# Patient Record
Sex: Female | Born: 1950 | Race: White | Hispanic: No | Marital: Married | Smoking: Former smoker
Health system: Southern US, Community
[De-identification: ages and names within clinical notes are randomized; demographics above are authoritative.]

## PROBLEM LIST (undated history)

## (undated) DIAGNOSIS — K219 Gastro-esophageal reflux disease without esophagitis: Secondary | ICD-10-CM

## (undated) DIAGNOSIS — I10 Essential (primary) hypertension: Secondary | ICD-10-CM

## (undated) HISTORY — PX: CORONARY ANGIOPLASTY WITH STENT PLACEMENT: SHX49

## (undated) HISTORY — PX: ABDOMINAL HYSTERECTOMY: SHX81

---

## 2004-07-03 ENCOUNTER — Ambulatory Visit: Payer: Self-pay | Admitting: Obstetrics and Gynecology

## 2004-08-04 ENCOUNTER — Other Ambulatory Visit: Payer: Self-pay

## 2004-08-27 ENCOUNTER — Ambulatory Visit: Payer: Self-pay | Admitting: Obstetrics and Gynecology

## 2007-02-20 IMAGING — US US PELV - US TRANSVAGINAL
1 series · 17 of 17 positions shown · non-contrast
Comparison: none

REASON FOR EXAM: RLQ mass
COMMENTS:

PROCEDURE:     US  - US PELVIS MASS EXAM  - [DATE] [DATE] [DATE]  [DATE]
RESULT:        Real-time imaging was obtained.  The uterus is not identified
compatible with prior hysterectomy.  The ovaries are not visualized.  No
definitive adnexal masses and no fluid is noted in the pelvis.

[Series 1: us pelv - us transvaginal · 17 of 17 slices shown]
[im 1/17]
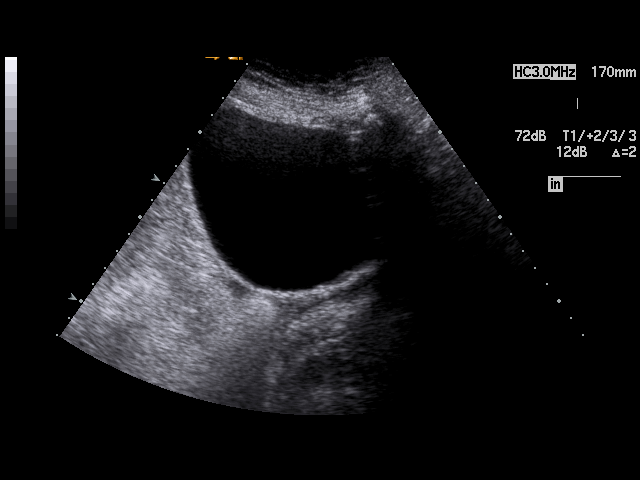
[im 2/17]
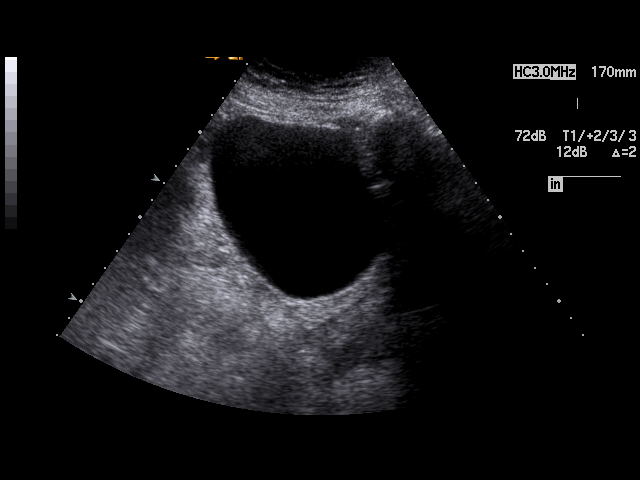
[im 3/17]
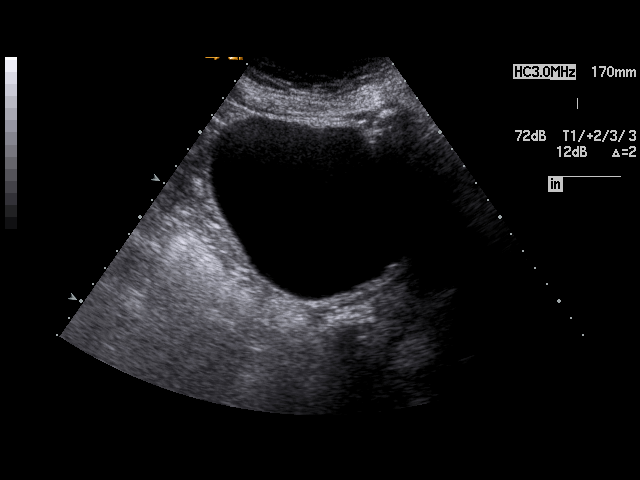
[im 4/17]
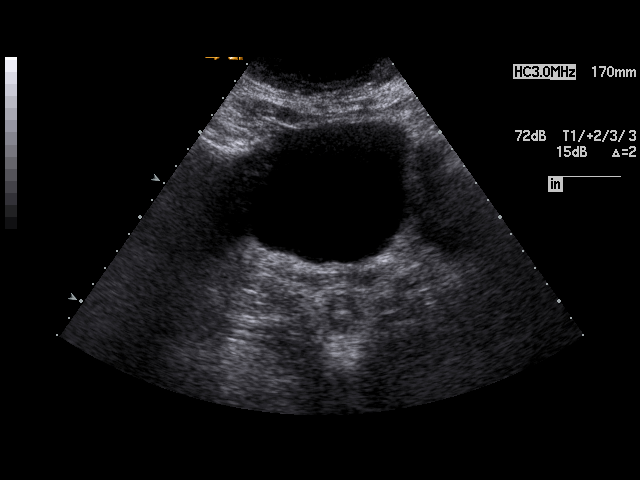
[im 5/17]
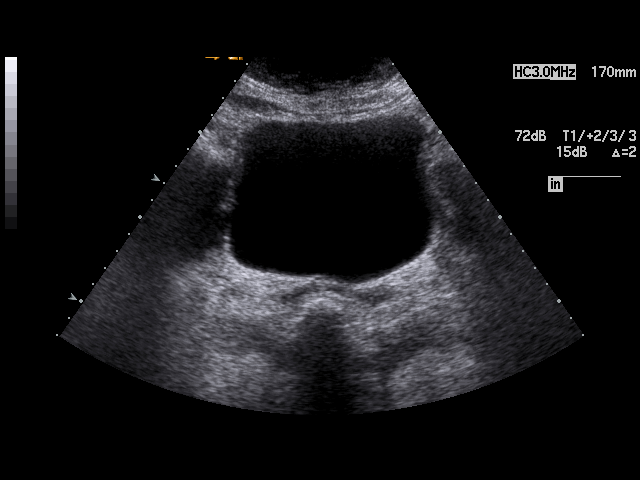
[im 6/17]
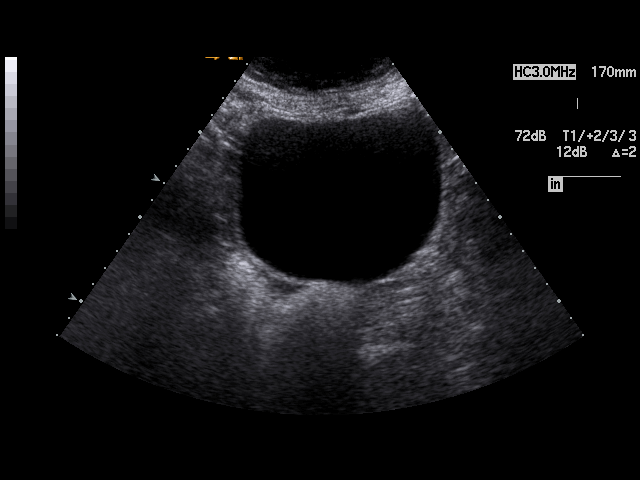
[im 7/17]
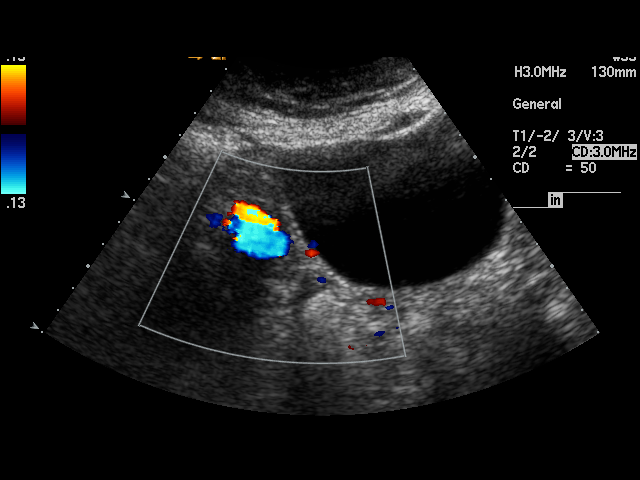
[im 8/17]
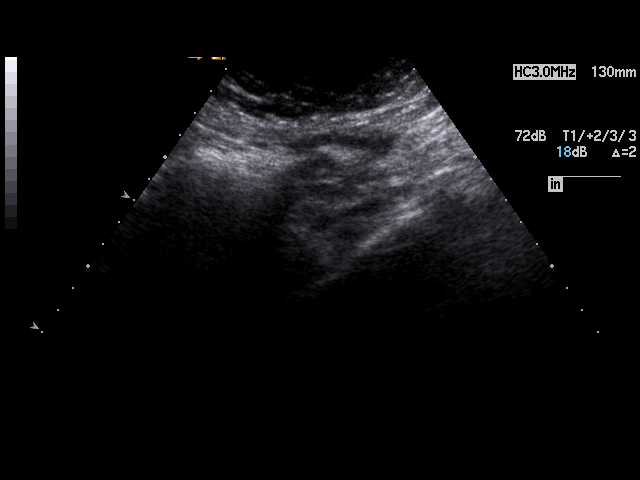
[im 9/17]
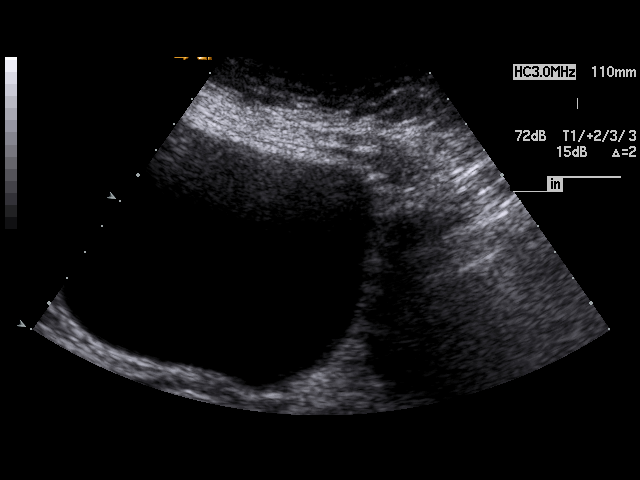
[im 10/17]
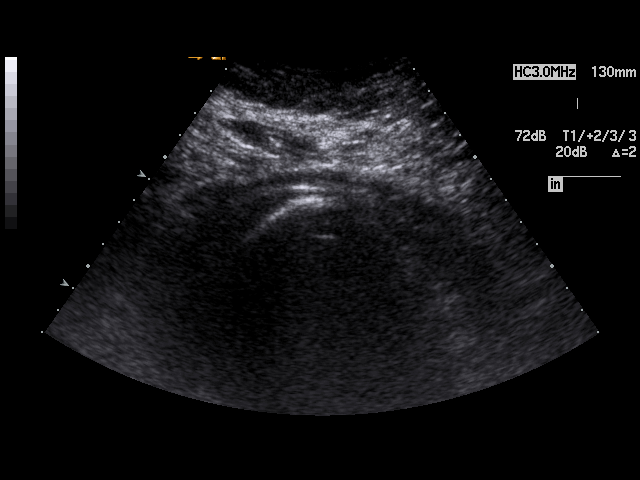
[im 11/17]
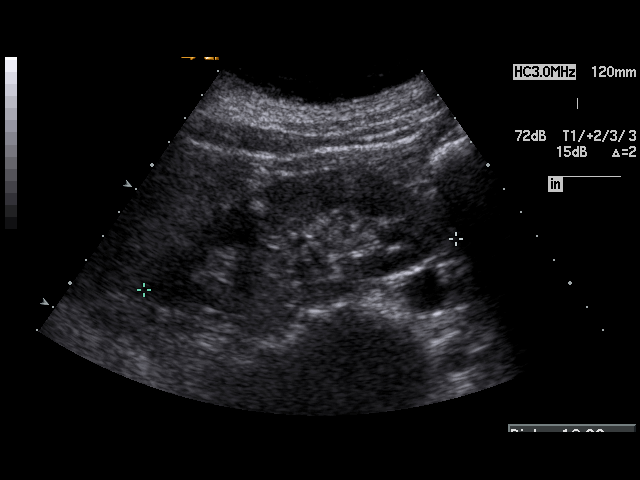
[im 12/17]
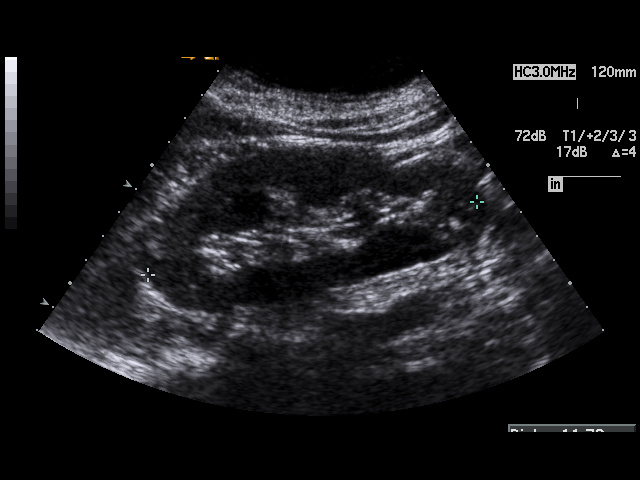
[im 13/17]
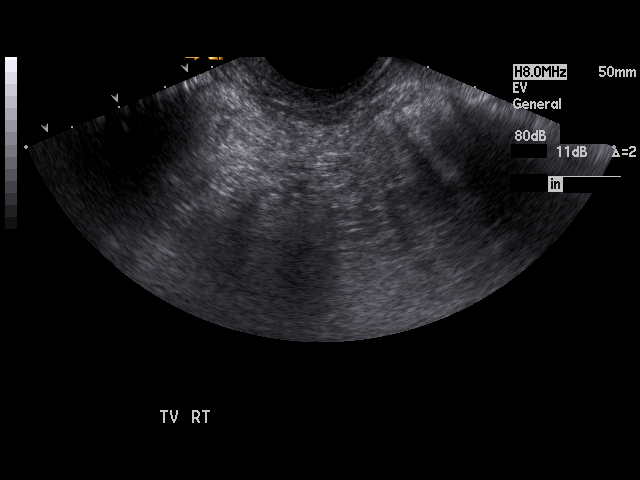
[im 14/17]
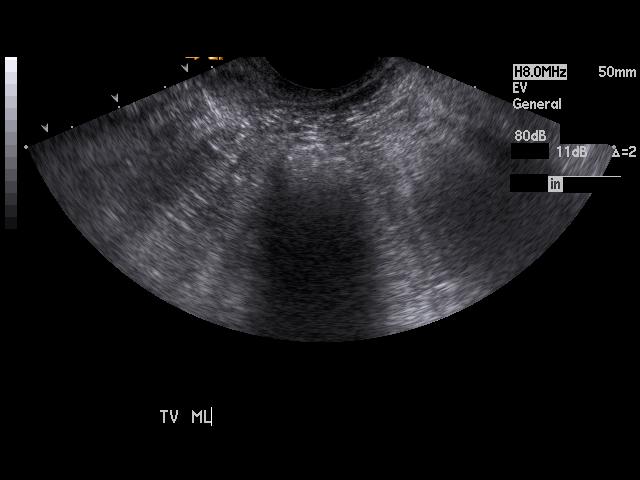
[im 15/17]
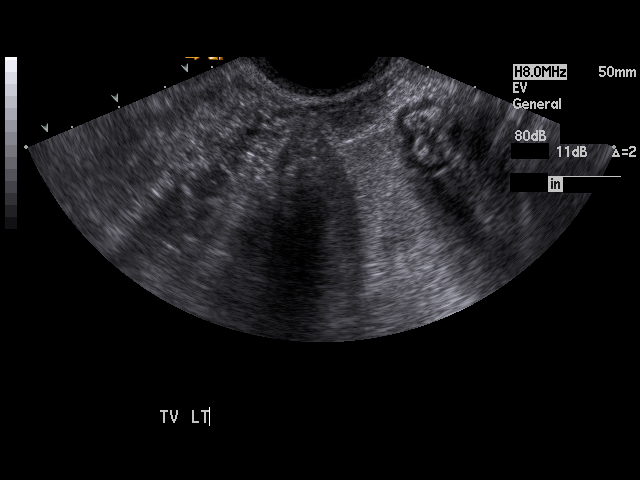
[im 16/17]
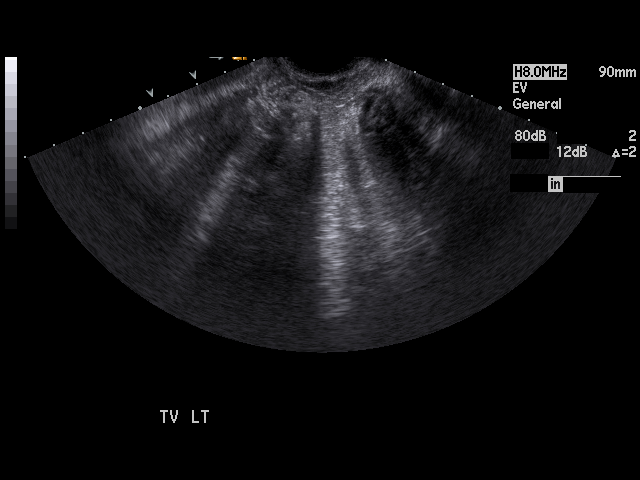
[im 17/17]
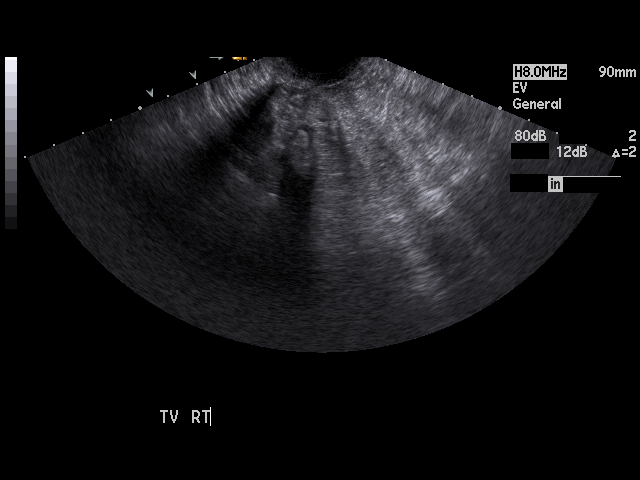

[17 of 17 positions shown; findings below may reference images not displayed]

IMPRESSION: No significant abnormalities noted on this post
hysterectomy patient.

## 2007-03-24 IMAGING — CR DG CHEST 2V
1 series · 2 of 2 positions shown · non-contrast
Comparison: none

REASON FOR EXAM: htn
COMMENTS:

PROCEDURE:     DXR - DXR CHEST PA (OR AP) AND LATERAL  - August 04, 2004  [DATE]
RESULT:        PA and lateral view reveals the heart to be normal in size.
The lung fields appear clear.  No effusions are noted.  Vascularity is
within normal limits.

[Series 208: postero_anterior · 0.22mm/px · 2 of 2 slices shown]
[im 1/2]
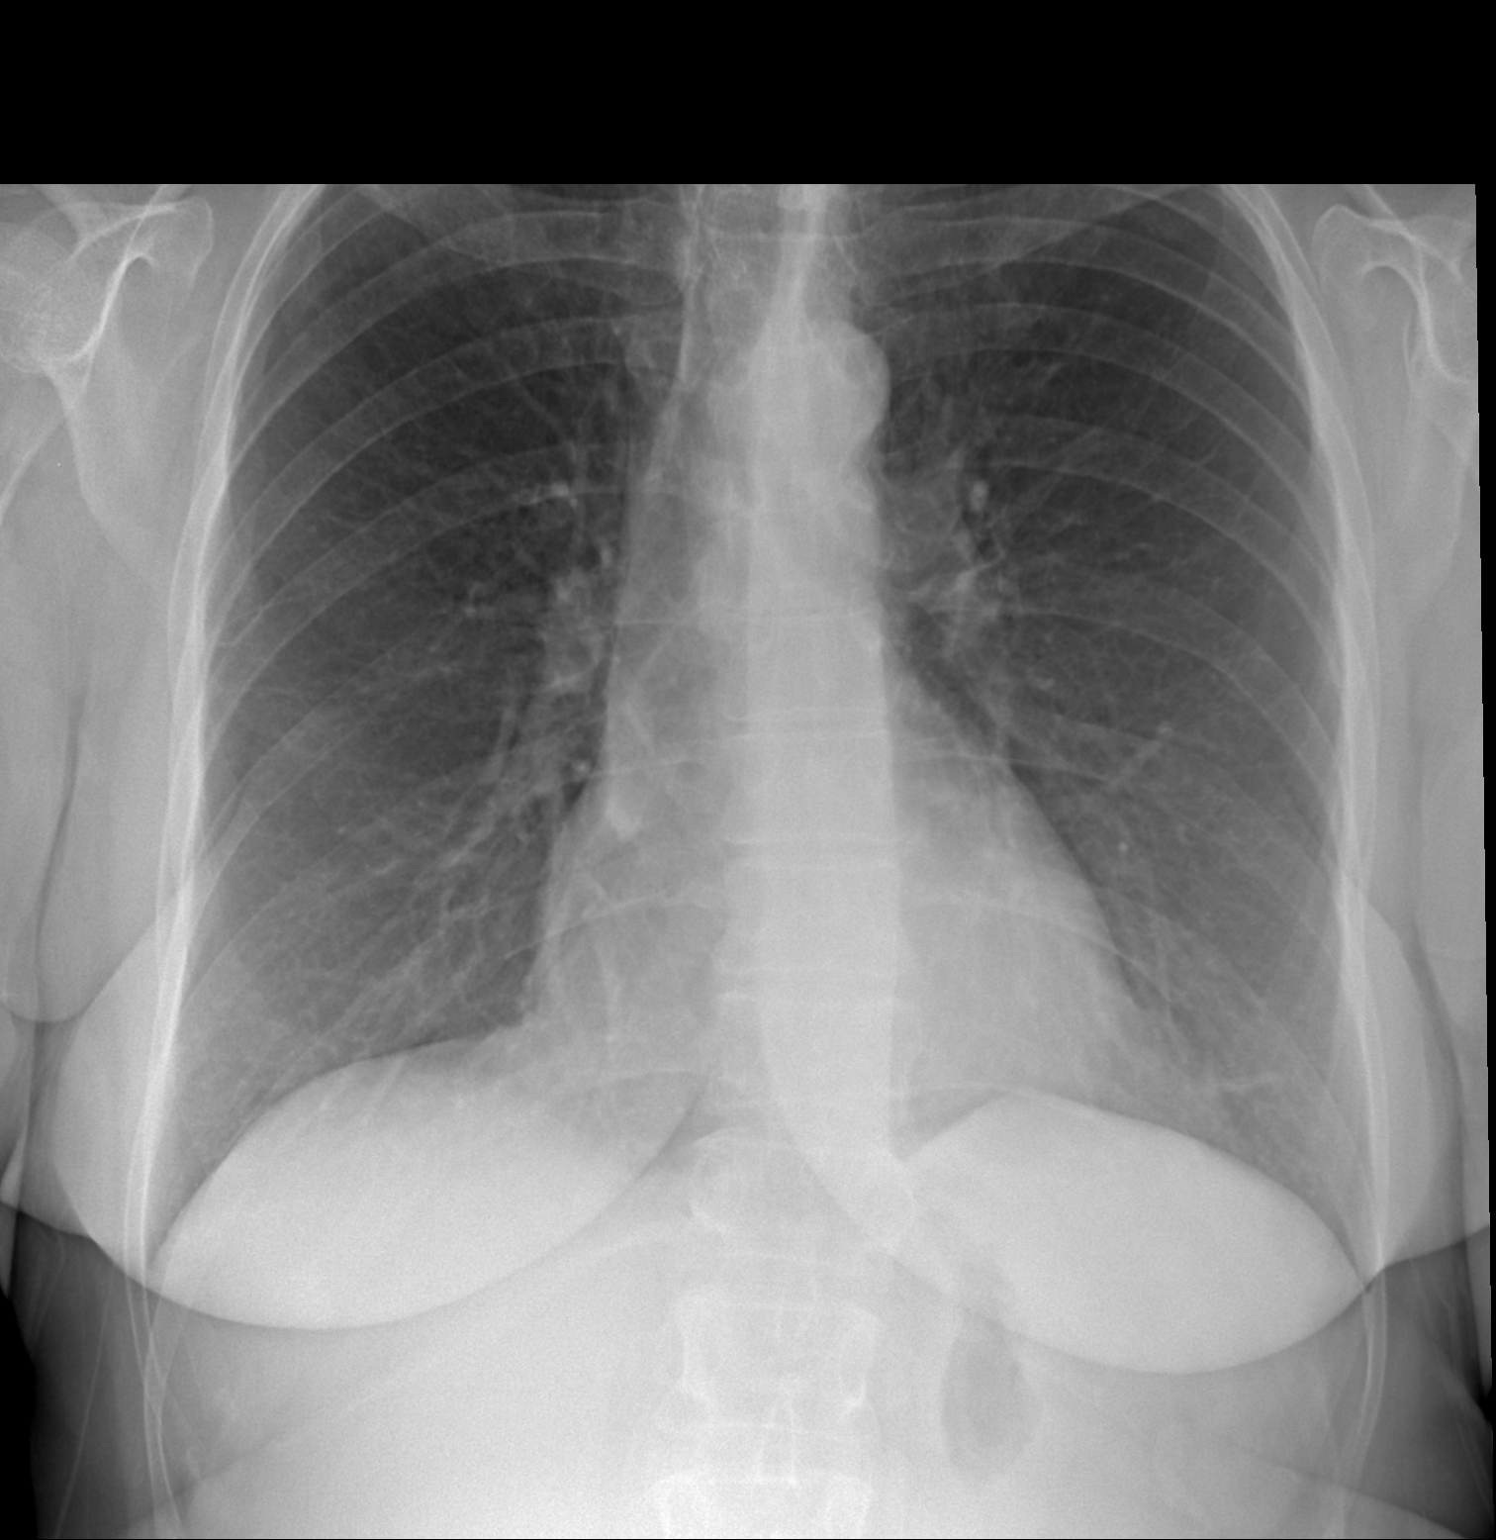
[im 2/2]
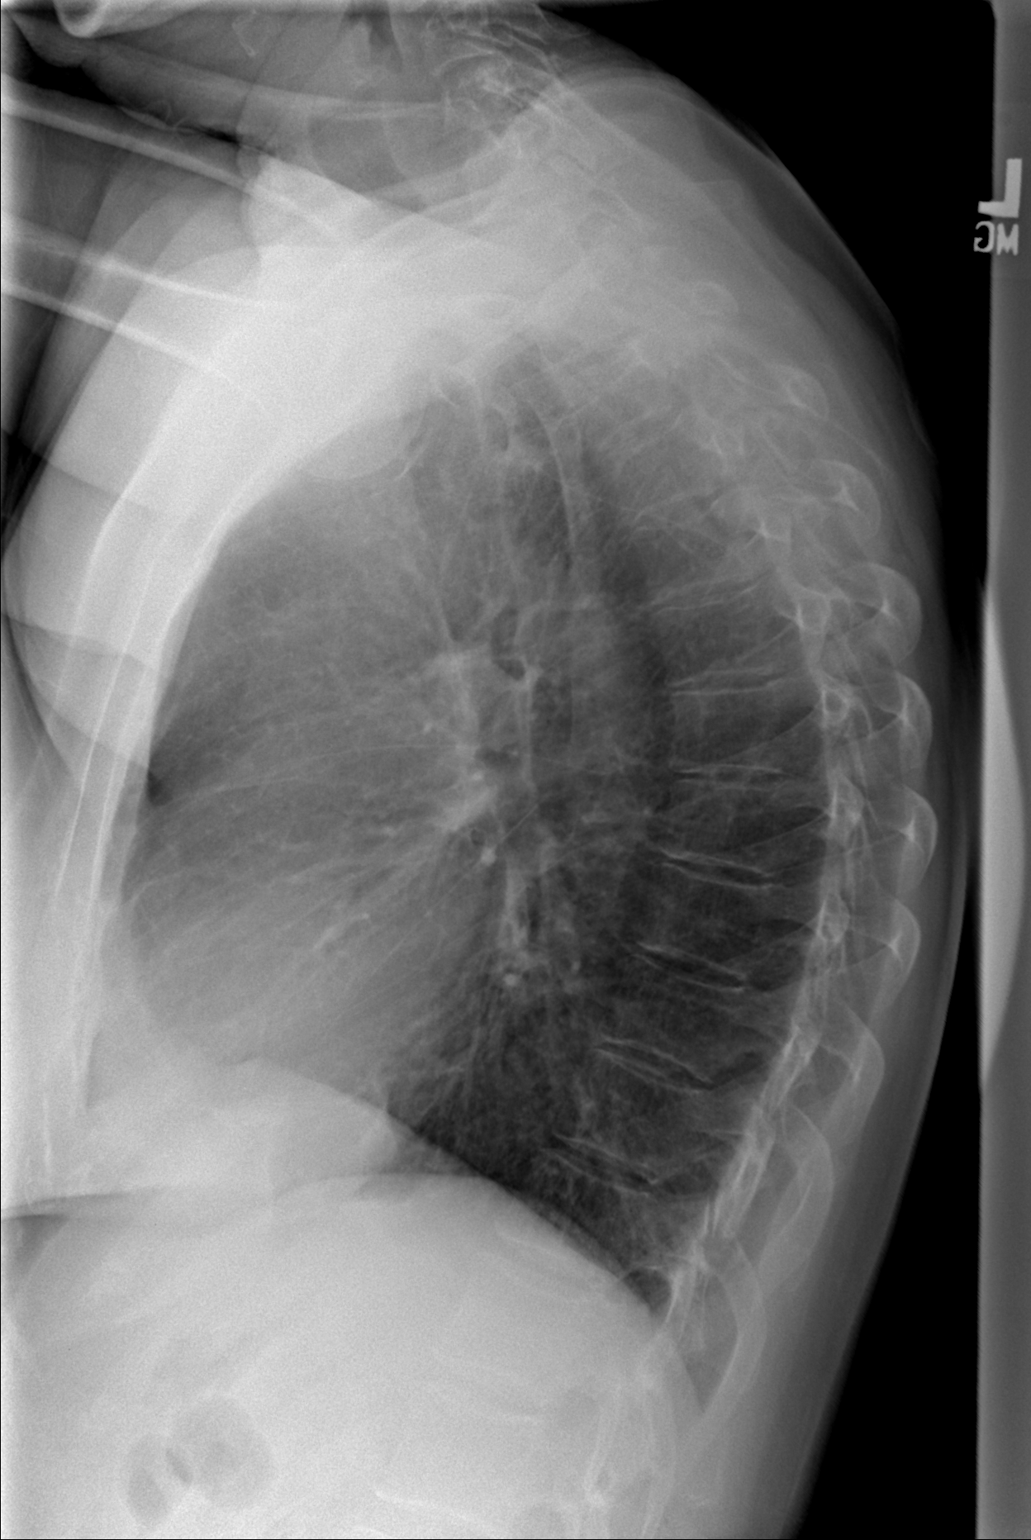

[2 of 2 positions shown; findings below may reference images not displayed]

IMPRESSION: The lung fields are clear.

## 2007-10-14 ENCOUNTER — Ambulatory Visit: Payer: Self-pay | Admitting: Internal Medicine

## 2007-11-20 ENCOUNTER — Ambulatory Visit: Payer: Self-pay | Admitting: Family Medicine

## 2008-07-11 ENCOUNTER — Ambulatory Visit: Payer: Self-pay | Admitting: Internal Medicine

## 2008-12-03 ENCOUNTER — Ambulatory Visit: Payer: Self-pay | Admitting: Internal Medicine

## 2013-08-25 ENCOUNTER — Ambulatory Visit: Payer: Self-pay | Admitting: Internal Medicine

## 2013-08-25 LAB — COMPREHENSIVE METABOLIC PANEL
ALBUMIN: 4.5 g/dL (ref 3.4–5.0)
ALK PHOS: 127 U/L — AB
AST: 22 U/L (ref 15–37)
Anion Gap: 9 (ref 7–16)
BUN: 20 mg/dL — ABNORMAL HIGH (ref 7–18)
Bilirubin,Total: 0.6 mg/dL (ref 0.2–1.0)
CHLORIDE: 102 mmol/L (ref 98–107)
CO2: 28 mmol/L (ref 21–32)
CREATININE: 0.76 mg/dL (ref 0.60–1.30)
Calcium, Total: 9.7 mg/dL (ref 8.5–10.1)
Glucose: 104 mg/dL — ABNORMAL HIGH (ref 65–99)
OSMOLALITY: 280 (ref 275–301)
Potassium: 4 mmol/L (ref 3.5–5.1)
SGPT (ALT): 31 U/L (ref 12–78)
SODIUM: 139 mmol/L (ref 136–145)
Total Protein: 8.9 g/dL — ABNORMAL HIGH (ref 6.4–8.2)

## 2013-08-25 LAB — CBC WITH DIFFERENTIAL/PLATELET
BASOS ABS: 0.1 10*3/uL (ref 0.0–0.1)
BASOS PCT: 0.9 %
Eosinophil #: 0.2 10*3/uL (ref 0.0–0.7)
Eosinophil %: 2.1 %
HCT: 43.3 % (ref 35.0–47.0)
HGB: 14.4 g/dL (ref 12.0–16.0)
Lymphocyte #: 1.7 10*3/uL (ref 1.0–3.6)
Lymphocyte %: 19.1 %
MCH: 29.4 pg (ref 26.0–34.0)
MCHC: 33.3 g/dL (ref 32.0–36.0)
MCV: 88 fL (ref 80–100)
MONO ABS: 0.4 x10 3/mm (ref 0.2–0.9)
Monocyte %: 4.4 %
Neutrophil #: 6.4 10*3/uL (ref 1.4–6.5)
Neutrophil %: 73.5 %
Platelet: 273 10*3/uL (ref 150–440)
RBC: 4.91 10*6/uL (ref 3.80–5.20)
RDW: 14.1 % (ref 11.5–14.5)
WBC: 8.7 10*3/uL (ref 3.6–11.0)

## 2013-08-25 LAB — URINALYSIS, COMPLETE
BILIRUBIN, UR: NEGATIVE
Blood: NEGATIVE
Glucose,UR: NEGATIVE mg/dL (ref 0–75)
Ketone: NEGATIVE
NITRITE: NEGATIVE
PROTEIN: NEGATIVE
Ph: 6 (ref 4.5–8.0)
RBC,UR: NONE SEEN /HPF (ref 0–5)
SPECIFIC GRAVITY: 1.02 (ref 1.003–1.030)

## 2013-08-25 LAB — SEDIMENTATION RATE: Erythrocyte Sed Rate: 6 mm/hr (ref 0–30)

## 2014-01-06 ENCOUNTER — Ambulatory Visit: Payer: Self-pay | Admitting: Physician Assistant

## 2014-04-05 ENCOUNTER — Ambulatory Visit: Payer: Self-pay | Admitting: Physician Assistant

## 2014-04-05 DIAGNOSIS — H6980 Other specified disorders of Eustachian tube, unspecified ear: Secondary | ICD-10-CM | POA: Diagnosis not present

## 2014-04-05 DIAGNOSIS — J329 Chronic sinusitis, unspecified: Secondary | ICD-10-CM | POA: Diagnosis not present

## 2014-04-05 DIAGNOSIS — I10 Essential (primary) hypertension: Secondary | ICD-10-CM | POA: Diagnosis not present

## 2014-04-12 DIAGNOSIS — E780 Pure hypercholesterolemia: Secondary | ICD-10-CM | POA: Diagnosis not present

## 2014-04-12 DIAGNOSIS — I209 Angina pectoris, unspecified: Secondary | ICD-10-CM | POA: Diagnosis not present

## 2014-05-17 DIAGNOSIS — M79672 Pain in left foot: Secondary | ICD-10-CM | POA: Diagnosis not present

## 2014-06-05 DIAGNOSIS — M25571 Pain in right ankle and joints of right foot: Secondary | ICD-10-CM | POA: Diagnosis not present

## 2014-06-10 DIAGNOSIS — Z79899 Other long term (current) drug therapy: Secondary | ICD-10-CM | POA: Diagnosis not present

## 2014-06-10 DIAGNOSIS — M25571 Pain in right ankle and joints of right foot: Secondary | ICD-10-CM | POA: Diagnosis not present

## 2014-06-10 DIAGNOSIS — I252 Old myocardial infarction: Secondary | ICD-10-CM | POA: Diagnosis not present

## 2014-06-10 DIAGNOSIS — K219 Gastro-esophageal reflux disease without esophagitis: Secondary | ICD-10-CM | POA: Diagnosis not present

## 2014-06-10 DIAGNOSIS — E785 Hyperlipidemia, unspecified: Secondary | ICD-10-CM | POA: Diagnosis not present

## 2014-06-10 DIAGNOSIS — T8484XA Pain due to internal orthopedic prosthetic devices, implants and grafts, initial encounter: Secondary | ICD-10-CM | POA: Diagnosis not present

## 2014-06-10 DIAGNOSIS — G8918 Other acute postprocedural pain: Secondary | ICD-10-CM | POA: Diagnosis not present

## 2014-06-10 DIAGNOSIS — Z01818 Encounter for other preprocedural examination: Secondary | ICD-10-CM | POA: Diagnosis not present

## 2014-06-10 DIAGNOSIS — M1990 Unspecified osteoarthritis, unspecified site: Secondary | ICD-10-CM | POA: Diagnosis not present

## 2014-06-10 DIAGNOSIS — I10 Essential (primary) hypertension: Secondary | ICD-10-CM | POA: Diagnosis not present

## 2014-06-10 DIAGNOSIS — Z955 Presence of coronary angioplasty implant and graft: Secondary | ICD-10-CM | POA: Diagnosis not present

## 2014-06-10 DIAGNOSIS — F40240 Claustrophobia: Secondary | ICD-10-CM | POA: Diagnosis not present

## 2014-06-13 DIAGNOSIS — G8918 Other acute postprocedural pain: Secondary | ICD-10-CM | POA: Diagnosis not present

## 2014-06-13 DIAGNOSIS — F40240 Claustrophobia: Secondary | ICD-10-CM | POA: Diagnosis not present

## 2014-06-13 DIAGNOSIS — M25571 Pain in right ankle and joints of right foot: Secondary | ICD-10-CM | POA: Diagnosis not present

## 2014-06-13 DIAGNOSIS — T8484XA Pain due to internal orthopedic prosthetic devices, implants and grafts, initial encounter: Secondary | ICD-10-CM | POA: Diagnosis not present

## 2014-06-13 DIAGNOSIS — I252 Old myocardial infarction: Secondary | ICD-10-CM | POA: Diagnosis not present

## 2014-06-13 DIAGNOSIS — I10 Essential (primary) hypertension: Secondary | ICD-10-CM | POA: Diagnosis not present

## 2014-08-22 DIAGNOSIS — E780 Pure hypercholesterolemia: Secondary | ICD-10-CM | POA: Diagnosis not present

## 2014-11-22 DIAGNOSIS — E780 Pure hypercholesterolemia: Secondary | ICD-10-CM | POA: Diagnosis not present

## 2015-01-01 DIAGNOSIS — I159 Secondary hypertension, unspecified: Secondary | ICD-10-CM | POA: Diagnosis not present

## 2015-01-01 DIAGNOSIS — I209 Angina pectoris, unspecified: Secondary | ICD-10-CM | POA: Diagnosis not present

## 2015-01-01 DIAGNOSIS — E7800 Pure hypercholesterolemia, unspecified: Secondary | ICD-10-CM | POA: Diagnosis not present

## 2015-11-28 ENCOUNTER — Ambulatory Visit
Admission: EM | Admit: 2015-11-28 | Discharge: 2015-11-28 | Disposition: A | Discharge status: Home / Self Care | Payer: Medicare Other | Attending: Family Medicine | Admitting: Family Medicine

## 2015-11-28 DIAGNOSIS — J0100 Acute maxillary sinusitis, unspecified: Secondary | ICD-10-CM | POA: Diagnosis not present

## 2015-11-28 MED ORDER — AMOXICILLIN-POT CLAVULANATE 875-125 MG PO TABS: 1.0000 | ORAL_TABLET | Freq: Two times a day (BID) | ORAL | 0 refills | Status: DC

## 2015-11-28 NOTE — ED Triage Notes (Signed)
Patient complains of sinus pain and pressure that started over 1 week ago. Patient states that she has been noticing tops of her gums are also sore.

## 2015-11-28 NOTE — ED Provider Notes (Signed)
[facility]    CSN: [id number] Arrival date & time: 11/28/15  [id number]  First Provider Contact:  None       History   Chief Complaint Chief Complaint  Patient presents with  . Sinus Problem    HPI Connie Blackwell is a 65 y.o. female.   The history is provided by the patient.  URI  Presenting symptoms: congestion, facial pain and fever   Severity:  Moderate Onset quality:  Sudden Duration:  1 week Timing:  Constant Progression:  Worsening Chronicity:  New Relieved by:  Nothing Ineffective treatments:  OTC medications Associated symptoms: sinus pain   Risk factors: sick contacts   Risk factors: not elderly, no chronic cardiac disease, no chronic kidney disease, no chronic respiratory disease, no diabetes mellitus, no immunosuppression, no recent illness and no recent travel     History reviewed. No pertinent past medical history.  There are no active problems to display for this patient.   Past Surgical History:  Procedure Laterality Date  . CORONARY ANGIOPLASTY WITH STENT PLACEMENT      OB History    No data available       Home Medications    Prior to Admission medications   Medication Sig Start Date End Date Taking? Authorizing Provider  atenolol (TENORMIN) 25 MG tablet Take 25 mg by mouth daily.   Yes Historical Provider, MD  famotidine (PEPCID) 20 MG tablet Take 20 mg by mouth 2 (two) times daily.   Yes Historical Provider, MD  amoxicillin-clavulanate (AUGMENTIN) 875-125 MG tablet Take 1 tablet by mouth 2 (two) times daily. 11/28/15   Payton Mccallumrlando Helena Sardo, MD    Family History History reviewed. No pertinent family history.  Social History Social History  Substance Use Topics  . Smoking status: Former Games developermoker  . Smokeless tobacco: Never Used  . Alcohol use No     Allergies   Sulfa antibiotics   Review of Systems Review of Systems  Constitutional: Positive for fever.  HENT: Positive for congestion.      Physical Exam Triage  Vital Signs ED Triage Vitals  Enc Vitals Group     BP 11/28/15 1037 (!) 208/70     Pulse Rate 11/28/15 1037 (!) 57     Resp 11/28/15 1037 16     Temp 11/28/15 1037 97.2 F (36.2 C)     Temp Source 11/28/15 1037 Tympanic     SpO2 11/28/15 1037 99 %     Weight 11/28/15 1034 162 lb (73.5 kg)     Height 11/28/15 1034 5\' 7"  (1.702 m)     Head Circumference --      Peak Flow --      Pain Score 11/28/15 1036 7     Pain Loc --      Pain Edu? --      Excl. in GC? --    No data found.   Updated Vital Signs BP (!) 142/82 (BP Location: Left Arm)   Pulse (!) 57   Temp 97.2 F (36.2 C) (Tympanic)   Resp 16   Ht 5\' 7"  (1.702 m)   Wt 162 lb (73.5 kg)   SpO2 99%   BMI 25.37 kg/m   Visual Acuity Right Eye Distance:   Left Eye Distance:   Bilateral Distance:    Right Eye Near:   Left Eye Near:    Bilateral Near:     Physical Exam  Constitutional: She appears well-developed and well-nourished. No distress.  HENT:  Head: Normocephalic  and atraumatic.  Right Ear: Tympanic membrane, external ear and ear canal normal.  Left Ear: Tympanic membrane, external ear and ear canal normal.  Nose: Mucosal edema and rhinorrhea present. No nose lacerations, sinus tenderness, nasal deformity, septal deviation or nasal septal hematoma. No epistaxis.  No foreign bodies. Right sinus exhibits maxillary sinus tenderness and frontal sinus tenderness. Left sinus exhibits maxillary sinus tenderness and frontal sinus tenderness.  Mouth/Throat: Uvula is midline, oropharynx is clear and moist and mucous membranes are normal. No oropharyngeal exudate.  Eyes: Conjunctivae and EOM are normal. Pupils are equal, round, and reactive to light. Right eye exhibits no discharge. Left eye exhibits no discharge. No scleral icterus.  Neck: Normal range of motion. Neck supple. No thyromegaly present.  Cardiovascular: Normal rate, regular rhythm and normal heart sounds.   Pulmonary/Chest: Effort normal and breath sounds  normal. No respiratory distress. She has no wheezes. She has no rales.  Lymphadenopathy:    She has no cervical adenopathy.  Skin: She is not diaphoretic.  Nursing note and vitals reviewed.    UC Treatments / Results  Labs (all labs ordered are listed, but only abnormal results are displayed) Labs Reviewed - No data to display  EKG  EKG Interpretation None       Radiology No results found.  Procedures Procedures (including critical care time)  Medications Ordered in UC Medications - No data to display   Initial Impression / Assessment and Plan / UC Course  I have reviewed the triage vital signs and the nursing notes.  Pertinent labs & imaging results that were available during my care of the patient were reviewed by me and considered in my medical decision making (see chart for details).  Clinical Course      Final Clinical Impressions(s) / UC Diagnoses   Final diagnoses:  Acute maxillary sinusitis, recurrence not specified    New Prescriptions Discharge Medication List as of 11/28/2015 12:03 PM    START taking these medications   Details  amoxicillin-clavulanate (AUGMENTIN) 875-125 MG tablet Take 1 tablet by mouth 2 (two) times daily., Starting Fri 11/28/2015, Normal       1. diagnosis reviewed with patient 2. rx as per orders above; reviewed possible side effects, interactions, risks and benefits  3. Recommend supportive treatment with otc analgesics 4. Follow-up prn if symptoms worsen or don't improve   Payton Mccallum, MD 11/28/15 1447

## 2016-01-16 DIAGNOSIS — I10 Essential (primary) hypertension: Secondary | ICD-10-CM | POA: Diagnosis not present

## 2016-01-16 DIAGNOSIS — E7800 Pure hypercholesterolemia, unspecified: Secondary | ICD-10-CM | POA: Diagnosis not present

## 2016-01-16 DIAGNOSIS — R0789 Other chest pain: Secondary | ICD-10-CM | POA: Diagnosis not present

## 2016-01-28 DIAGNOSIS — I10 Essential (primary) hypertension: Secondary | ICD-10-CM | POA: Diagnosis not present

## 2016-02-20 DIAGNOSIS — I10 Essential (primary) hypertension: Secondary | ICD-10-CM | POA: Diagnosis not present

## 2016-02-20 DIAGNOSIS — E7800 Pure hypercholesterolemia, unspecified: Secondary | ICD-10-CM | POA: Diagnosis not present

## 2016-04-11 ENCOUNTER — Ambulatory Visit
Admission: EM | Admit: 2016-04-11 | Discharge: 2016-04-11 | Disposition: A | Discharge status: Home / Self Care | Payer: Medicare Other | Attending: Emergency Medicine | Admitting: Emergency Medicine

## 2016-04-11 ENCOUNTER — Encounter: Payer: Self-pay | Admitting: Emergency Medicine

## 2016-04-11 DIAGNOSIS — K0510 Chronic gingivitis, plaque induced: Secondary | ICD-10-CM

## 2016-04-11 HISTORY — DX: Essential (primary) hypertension: I10

## 2016-04-11 HISTORY — DX: Gastro-esophageal reflux disease without esophagitis: K21.9

## 2016-04-11 MED ORDER — AMOXICILLIN-POT CLAVULANATE 875-125 MG PO TABS
1.0000 | ORAL_TABLET | Freq: Two times a day (BID) | ORAL | 0 refills | Status: DC
Start: 2016-04-11 — End: 2017-04-05

## 2016-04-11 NOTE — ED Provider Notes (Signed)
CSN: [id number]     Arrival date & time 04/11/16  1120 History   First MD Initiated Contact with Patient 04/11/16 1344     Chief Complaint  Patient presents with  . Headache  . Cough  . Generalized Body Aches   (Consider location/radiation/quality/duration/timing/severity/associated sxs/prior Treatment) HPI  This a 66 year old female who presents with cough take tooth pain and vomiting for the last 2 days. She states she has not been vomiting today. Has had pain and jaw pain which she is wondering if may have caused her symptoms that she is presenting with. Unable to afford seeing a dentist and has been on the waiting list at the health department for a year. He tells me today that she waits until her teeth loosen and she will then pull them herself. Her remaining teeth are in very poor condition as are her gumss. She has been in bed for 2 days the nausea vomiting and fatigue but has since recovered from those complaints      Past Medical History:  Diagnosis Date  . GERD (gastroesophageal reflux disease)   . Hypertension    Past Surgical History:  Procedure Laterality Date  . ABDOMINAL HYSTERECTOMY    . CORONARY ANGIOPLASTY WITH STENT PLACEMENT     History reviewed. No pertinent family history. Social History  Substance Use Topics  . Smoking status: Former Games developermoker  . Smokeless tobacco: Never Used  . Alcohol use No   OB History    No data available     Review of Systems  Constitutional: Positive for activity change, fatigue and fever. Negative for chills.  HENT: Positive for dental problem.   Respiratory: Negative for cough, shortness of breath, wheezing and stridor.   All other systems reviewed and are negative.   Allergies  Sulfa antibiotics  Home Medications   Prior to Admission medications   Medication Sig Start Date End Date Taking? Authorizing Provider  amoxicillin-clavulanate (AUGMENTIN) 875-125 MG tablet Take 1 tablet by mouth every 12 (twelve) hours. 04/11/16    Lutricia FeilWilliam P Roemer, PA-C  atenolol (TENORMIN) 25 MG tablet Take 25 mg by mouth daily.    Historical Provider, MD  famotidine (PEPCID) 20 MG tablet Take 20 mg by mouth 2 (two) times daily.    Historical Provider, MD   Meds Ordered and Administered this Visit  Medications - No data to display  BP 134/64 (BP Location: Left Arm)   Pulse 64   Temp 98.9 F (37.2 C) (Oral)   Resp 16   Ht 5\' 7"  (1.702 m)   Wt 162 lb (73.5 kg)   SpO2 98%   BMI 25.37 kg/m  No data found.   Physical Exam  Constitutional: She is oriented to person, place, and time. She appears well-developed and well-nourished. No distress.  HENT:  Head: Normocephalic and atraumatic.  Right Ear: External ear normal.  Left Ear: External ear normal.  Mouth/Throat: Oropharynx is clear and moist.  Examination of the teeth and gums shows them to be in very poor repair with sparse amount of teeth remaining. Gums receede from the tooth itself exposing the pulp. numerous caries present. Gums are very erythematous and have an orange to purple hue to them. He has several nodes in anterior cervical chain and submandibular.  Eyes: EOM are normal. Pupils are equal, round, and reactive to light. Right eye exhibits no discharge. Left eye exhibits no discharge.  Neck: Normal range of motion. Neck supple.  Pulmonary/Chest: Effort normal and breath sounds normal. She has  no wheezes. She has no rales.  Musculoskeletal: Normal range of motion.  Lymphadenopathy:    She has cervical adenopathy.  Neurological: She is alert and oriented to person, place, and time.  Skin: Skin is warm and dry. She is not diaphoretic.  Psychiatric: She has a normal mood and affect. Her behavior is normal. Judgment and thought content normal.  Nursing note and vitals reviewed.   Urgent Care Course     Procedures (including critical care time)  Labs Review Labs Reviewed - No data to display  Imaging Review No results found.   Visual Acuity Review  Right  Eye Distance:   Left Eye Distance:   Bilateral Distance:    Right Eye Near:   Left Eye Near:    Bilateral Near:         MDM   1. Gingivitis    Discharge Medication List as of 04/11/2016  2:01 PM    START taking these medications   Details  amoxicillin-clavulanate (AUGMENTIN) 875-125 MG tablet Take 1 tablet by mouth every 12 (twelve) hours., Starting Sun 04/11/2016, Normal      Discussion with the patient regarding the poor state of repair that her teeth are in. Been trying to have them fixed seems to be in a long list at the health department. I gave her couple of options including [facility] and also a dentist as except Medicare Medicaid. We will try to arrange an appointment with either place as soon as possible. Meantime I have given her information on gingivitis. Place her on a course of Augmentin due totenderness  and erythematous gums that she presents with.    Lutricia Feil, PA-C 04/11/16 1418

## 2016-04-11 NOTE — ED Triage Notes (Signed)
Patient c/o fever, cough HAs, and bodyaches for the past 2 days.

## 2016-04-28 DIAGNOSIS — I10 Essential (primary) hypertension: Secondary | ICD-10-CM | POA: Diagnosis not present

## 2016-05-12 DIAGNOSIS — I10 Essential (primary) hypertension: Secondary | ICD-10-CM | POA: Diagnosis not present

## 2016-05-24 ENCOUNTER — Encounter: Payer: Self-pay | Admitting: *Deleted

## 2016-05-24 ENCOUNTER — Ambulatory Visit
Admission: EM | Admit: 2016-05-24 | Discharge: 2016-05-24 | Disposition: A | Discharge status: Home / Self Care | Payer: Medicare Other | Attending: Family Medicine | Admitting: Family Medicine

## 2016-05-24 DIAGNOSIS — H6500 Acute serous otitis media, unspecified ear: Secondary | ICD-10-CM

## 2016-05-24 DIAGNOSIS — H6501 Acute serous otitis media, right ear: Secondary | ICD-10-CM | POA: Diagnosis not present

## 2016-05-24 MED ORDER — AMOXICILLIN 875 MG PO TABS: 875.0000 mg | ORAL_TABLET | Freq: Two times a day (BID) | ORAL | 0 refills | Status: DC

## 2016-05-24 NOTE — ED Provider Notes (Signed)
[facility]    CSN: [id number] Arrival date & time: 05/24/16  1157     History   Chief Complaint Chief Complaint  Patient presents with  . Otalgia  . Headache    HPI Connie Blackwell is a 66 y.o. female.   The history is provided by the patient.  Otalgia  Location:  Right Behind ear:  No abnormality Quality:  Aching and dull Severity:  Mild Onset quality:  Sudden Duration:  3 days Timing:  Constant Progression:  Worsening Chronicity:  New Context: not direct blow, not elevation change, not foreign body in ear, not loud noise, not recent URI and not water in ear   Relieved by:  None tried Ineffective treatments:  None tried Associated symptoms: congestion, headaches and rhinorrhea   Associated symptoms: no abdominal pain, no cough, no diarrhea, no ear discharge, no fever, no hearing loss, no neck pain, no rash, no sore throat, no tinnitus and no vomiting   Headache  Associated symptoms: congestion and ear pain   Associated symptoms: no abdominal pain, no cough, no diarrhea, no fever, no hearing loss, no neck pain, no sore throat and no vomiting     Past Medical History:  Diagnosis Date  . GERD (gastroesophageal reflux disease)   . Hypertension     There are no active problems to display for this patient.   Past Surgical History:  Procedure Laterality Date  . ABDOMINAL HYSTERECTOMY    . CORONARY ANGIOPLASTY WITH STENT PLACEMENT      OB History    No data available       Home Medications    Prior to Admission medications   Medication Sig Start Date End Date Taking? Authorizing Provider  aspirin 81 MG chewable tablet Chew 81 mg by mouth daily.   Yes Historical Provider, MD  atenolol (TENORMIN) 25 MG tablet Take 25 mg by mouth daily.   Yes Historical Provider, MD  famotidine (PEPCID) 20 MG tablet Take 20 mg by mouth 2 (two) times daily.   Yes Historical Provider, MD  hydrochlorothiazide (HYDRODIURIL) 25 MG tablet Take 25 mg by mouth daily.    Yes Historical Provider, MD  amoxicillin (AMOXIL) 875 MG tablet Take 1 tablet (875 mg total) by mouth 2 (two) times daily. 05/24/16   Payton Mccallumrlando Othman Masur, MD  amoxicillin-clavulanate (AUGMENTIN) 875-125 MG tablet Take 1 tablet by mouth every 12 (twelve) hours. 04/11/16   Lutricia FeilWilliam P Roemer, PA-C    Family History History reviewed. No pertinent family history.  Social History Social History  Substance Use Topics  . Smoking status: Former Games developermoker  . Smokeless tobacco: Never Used  . Alcohol use No     Allergies   Sulfa antibiotics   Review of Systems Review of Systems  Constitutional: Negative for fever.  HENT: Positive for congestion, ear pain and rhinorrhea. Negative for ear discharge, hearing loss, sore throat and tinnitus.   Respiratory: Negative for cough.   Gastrointestinal: Negative for abdominal pain, diarrhea and vomiting.  Musculoskeletal: Negative for neck pain.  Skin: Negative for rash.  Neurological: Positive for headaches.     Physical Exam Triage Vital Signs ED Triage Vitals  Enc Vitals Group     BP 05/24/16 1225 (!) 177/72     Pulse Rate 05/24/16 1225 (!) 51     Resp 05/24/16 1225 15     Temp 05/24/16 1225 98.1 F (36.7 C)     Temp Source 05/24/16 1225 Oral     SpO2 05/24/16 1225 99 %  Weight --      Height --      Head Circumference --      Peak Flow --      Pain Score 05/24/16 1228 9     Pain Loc --      Pain Edu? --      Excl. in GC? --    No data found.   Updated Vital Signs BP (!) 177/72 (BP Location: Right Arm)   Pulse (!) 51   Temp 98.1 F (36.7 C) (Oral)   Resp 15   SpO2 99%   Visual Acuity Right Eye Distance:   Left Eye Distance:   Bilateral Distance:    Right Eye Near:   Left Eye Near:    Bilateral Near:     Physical Exam  Constitutional: She appears well-developed and well-nourished. No distress.  HENT:  Head: Normocephalic and atraumatic.  Right Ear: External ear and ear canal normal. Tympanic membrane is injected and  bulging.  Left Ear: Tympanic membrane, external ear and ear canal normal.  Nose: Mucosal edema and rhinorrhea present. No nose lacerations, sinus tenderness, nasal deformity, septal deviation or nasal septal hematoma. No epistaxis.  No foreign bodies. Right sinus exhibits no maxillary sinus tenderness and no frontal sinus tenderness. Left sinus exhibits no maxillary sinus tenderness and no frontal sinus tenderness.  Mouth/Throat: Uvula is midline, oropharynx is clear and moist and mucous membranes are normal. No oropharyngeal exudate.  Eyes: Conjunctivae and EOM are normal. Pupils are equal, round, and reactive to light. Right eye exhibits no discharge. Left eye exhibits no discharge. No scleral icterus.  Neck: Normal range of motion. Neck supple. No thyromegaly present.  Pulmonary/Chest: Effort normal and breath sounds normal. No respiratory distress.  Lymphadenopathy:    She has no cervical adenopathy.  Skin: She is not diaphoretic.  Nursing note and vitals reviewed.    UC Treatments / Results  Labs (all labs ordered are listed, but only abnormal results are displayed) Labs Reviewed - No data to display  EKG  EKG Interpretation None       Radiology No results found.  Procedures Procedures (including critical care time)  Medications Ordered in UC Medications - No data to display   Initial Impression / Assessment and Plan / UC Course  I have reviewed the triage vital signs and the nursing notes.  Pertinent labs & imaging results that were available during my care of the patient were reviewed by me and considered in my medical decision making (see chart for details).      Final Clinical Impressions(s) / UC Diagnoses   Final diagnoses:  Acute serous otitis media, recurrence not specified, unspecified laterality    New Prescriptions Discharge Medication List as of 05/24/2016  1:31 PM    START taking these medications   Details  amoxicillin (AMOXIL) 875 MG tablet  Take 1 tablet (875 mg total) by mouth 2 (two) times daily., Starting Mon 05/24/2016, Normal       1. diagnosis reviewed with patient 2. rx as per orders above; reviewed possible side effects, interactions, risks and benefits  3. Recommend supportive treatment with otc analgesics prn  4. Follow-up prn if symptoms worsen or don't improve   Payton Mccallum, MD 05/24/16 1338

## 2016-05-24 NOTE — ED Triage Notes (Signed)
Patient started having right ear pain 3 days ago. No previous history of ear pain.

## 2016-06-09 DIAGNOSIS — Z882 Allergy status to sulfonamides status: Secondary | ICD-10-CM | POA: Diagnosis not present

## 2016-06-09 DIAGNOSIS — I2510 Atherosclerotic heart disease of native coronary artery without angina pectoris: Secondary | ICD-10-CM | POA: Diagnosis not present

## 2016-06-09 DIAGNOSIS — E7800 Pure hypercholesterolemia, unspecified: Secondary | ICD-10-CM | POA: Diagnosis not present

## 2016-06-09 DIAGNOSIS — I10 Essential (primary) hypertension: Secondary | ICD-10-CM | POA: Diagnosis not present

## 2016-10-01 DIAGNOSIS — N3946 Mixed incontinence: Secondary | ICD-10-CM | POA: Diagnosis not present

## 2016-10-08 DIAGNOSIS — Z9114 Patient's other noncompliance with medication regimen: Secondary | ICD-10-CM | POA: Diagnosis not present

## 2016-10-08 DIAGNOSIS — I2510 Atherosclerotic heart disease of native coronary artery without angina pectoris: Secondary | ICD-10-CM | POA: Diagnosis not present

## 2016-10-08 DIAGNOSIS — E7800 Pure hypercholesterolemia, unspecified: Secondary | ICD-10-CM | POA: Diagnosis not present

## 2016-10-08 DIAGNOSIS — Z882 Allergy status to sulfonamides status: Secondary | ICD-10-CM | POA: Diagnosis not present

## 2016-10-08 DIAGNOSIS — I10 Essential (primary) hypertension: Secondary | ICD-10-CM | POA: Diagnosis not present

## 2016-10-08 DIAGNOSIS — I739 Peripheral vascular disease, unspecified: Secondary | ICD-10-CM | POA: Diagnosis not present

## 2016-10-08 DIAGNOSIS — M79606 Pain in leg, unspecified: Secondary | ICD-10-CM | POA: Diagnosis not present

## 2016-10-14 DIAGNOSIS — N3946 Mixed incontinence: Secondary | ICD-10-CM | POA: Diagnosis not present

## 2016-11-10 DIAGNOSIS — I10 Essential (primary) hypertension: Secondary | ICD-10-CM | POA: Diagnosis not present

## 2016-11-15 DIAGNOSIS — N3946 Mixed incontinence: Secondary | ICD-10-CM | POA: Diagnosis not present

## 2016-12-13 DIAGNOSIS — R51 Headache: Secondary | ICD-10-CM | POA: Diagnosis not present

## 2016-12-13 DIAGNOSIS — N3946 Mixed incontinence: Secondary | ICD-10-CM | POA: Diagnosis not present

## 2016-12-13 DIAGNOSIS — I10 Essential (primary) hypertension: Secondary | ICD-10-CM | POA: Diagnosis not present

## 2017-02-11 DIAGNOSIS — I209 Angina pectoris, unspecified: Secondary | ICD-10-CM | POA: Diagnosis not present

## 2017-02-11 DIAGNOSIS — I10 Essential (primary) hypertension: Secondary | ICD-10-CM | POA: Diagnosis not present

## 2017-04-05 ENCOUNTER — Other Ambulatory Visit: Payer: Self-pay

## 2017-04-05 ENCOUNTER — Ambulatory Visit
Admission: EM | Admit: 2017-04-05 | Discharge: 2017-04-05 | Disposition: A | Discharge status: Home / Self Care | Payer: Medicare HMO | Attending: Emergency Medicine | Admitting: Emergency Medicine

## 2017-04-05 DIAGNOSIS — I10 Essential (primary) hypertension: Secondary | ICD-10-CM

## 2017-04-05 DIAGNOSIS — R21 Rash and other nonspecific skin eruption: Secondary | ICD-10-CM

## 2017-04-05 DIAGNOSIS — J0100 Acute maxillary sinusitis, unspecified: Secondary | ICD-10-CM | POA: Diagnosis not present

## 2017-04-05 MED ORDER — PREDNISONE 10 MG (21) PO TBPK: ORAL_TABLET | ORAL | 0 refills | Status: DC

## 2017-04-05 MED ORDER — FLUTICASONE PROPIONATE 50 MCG/ACT NA SUSP: 2.0000 | Freq: Every day | NASAL | 0 refills | Status: AC

## 2017-04-05 MED ORDER — MUPIROCIN 2 % EX OINT: 1.0000 "application " | TOPICAL_OINTMENT | Freq: Three times a day (TID) | CUTANEOUS | 0 refills | Status: AC

## 2017-04-05 MED ORDER — TRIAMCINOLONE ACETONIDE 0.1 % EX CREA: 1.0000 "application " | TOPICAL_CREAM | Freq: Two times a day (BID) | CUTANEOUS | 0 refills | Status: AC

## 2017-04-05 NOTE — ED Provider Notes (Signed)
HPI  SUBJECTIVE:  Connie Blackwell is a 67 y.o. female who presents with 2 days of nasal congestion, sinus pain and pressure, states that her upper gums hurt and she reports bilateral ear fullness.  She denies rhinorrhea, postnasal drip.  She denies sore throat but states it "feels clogged".  Has occasional nonproductive cough.  No wheezing, chest pain, shortness of breath, fevers.  No allergy type symptoms.  No antibiotics in the past month or antipyretic in the past 6-8 hours.  She tried Vicks and Elderberry without improvement in her symptoms.  He has not tried any cold medications or saline nasal irrigation.  Second, she reports of bilateral rash on her forearms starting after she switched soaps from safeguard to [employer].  She states that the [facility] seems to dry her skin out.  She states that it itches and that the itching is worse at night.  The rash does not burn, she denies blisters.  No other new lotions, soaps, detergents, foods, change in her medications.  No sensation of being bitten at night, blood in the bedclothes in the morning, pets in the house.  No contacts with a similar rash.  She does not have any rash anywhere else.  She tried hydrocortisone ointment with some improvement in her symptoms.  No aggravating factors.  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  No history of diabetes.  States that she is compliant with her hydrochlorothiazide and her atenolol.  She is supposed to be on losartan but discontinued herself from this 4 months ago.  States that her cardiologist is aware of this.  She states that her baseline blood pressure is 140/76 at home and that she keeps a daily log of it.  Her blood pressure cuff has been calibrated recently.  States that her BP gets significantly elevated when she sees a doctor.  She states that when she saw her cardiologist last it was 200/101.  PMD: None.  Cardiology: Dr. Mariane Baumgarten.    Past Medical History:   Diagnosis Date  . GERD (gastroesophageal reflux disease)   . Hypertension     Past Surgical History:  Procedure Laterality Date  . ABDOMINAL HYSTERECTOMY    . CORONARY ANGIOPLASTY WITH STENT PLACEMENT      Family History  Problem Relation Age of Onset  . Cancer Mother   . Heart disease Father   . Alzheimer's disease Father     Social History   Tobacco Use  . Smoking status: Former Games developer  . Smokeless tobacco: Never Used  Substance Use Topics  . Alcohol use: No  . Drug use: No    No current facility-administered medications for this encounter.   Current Outpatient Medications:  .  aspirin 81 MG chewable tablet, Chew 81 mg by mouth daily., Disp: , Rfl:  .  atenolol (TENORMIN) 25 MG tablet, Take 25 mg by mouth daily., Disp: , Rfl:  .  famotidine (PEPCID) 20 MG tablet, Take 20 mg by mouth 2 (two) times daily., Disp: , Rfl:  .  hydrochlorothiazide (HYDRODIURIL) 25 MG tablet, Take 25 mg by mouth daily., Disp: , Rfl:  .  fluticasone (FLONASE) 50 MCG/ACT nasal spray, Place 2 sprays into both nostrils daily., Disp: 16 g, Rfl: 0 .  mupirocin ointment (BACTROBAN) 2 %, Apply 1 application topically 3 (three) times daily., Disp: 22 g, Rfl: 0 .  predniSONE (STERAPRED UNI-PAK 21 TAB) 10 MG (21) TBPK tablet, Dispense one 6 day pack. Take as directed  with food., Disp: 21 tablet, Rfl: 0 .  triamcinolone cream (KENALOG) 0.1 %, Apply 1 application topically 2 (two) times daily. Apply for 2 weeks. May use on face, Disp: 30 g, Rfl: 0  Allergies  Allergen Reactions  . Sulfa Antibiotics      ROS  As noted in HPI.   Physical Exam  BP (!) 193/64 (BP Location: Left Arm)   Pulse 60   Temp 98.5 F (36.9 C) (Oral)   Resp 17   Ht 5\' 7"  (1.702 m)   Wt 162 lb (73.5 kg)   SpO2 99%   BMI 25.37 kg/m   BP Readings from Last 3 Encounters:  04/05/17 (!) 193/64  05/24/16 (!) 177/72  04/11/16 134/64     Constitutional: Well developed, well nourished, no acute distress Eyes:  EOMI,  conjunctiva normal bilaterally HENT: Normocephalic, atraumatic,mucus membranes moist.  Positive nasal congestion.  Positive maxillary sinus tenderness.  No frontal sinus tenderness.  No obvious postnasal drip.  No appreciable cobblestoning.  Normal oropharynx with uvula midline. Respiratory: Normal inspiratory effort, good air movement, lungs clear bilaterally Cardiovascular: Normal rate GI: nondistended skin: Bilateral erythematous, nontender excoritations over her forearms.  There are no burrows between her fingers.  No palmar rash.  No rash elsewhere.     Musculoskeletal: no deformities Neurologic: Alert & oriented x 3, no focal neuro deficits Psychiatric: Speech and behavior appropriate   ED Course   Medications - No data to display  No orders of the defined types were placed in this encounter.   No results found for this or any previous visit (from the past 24 hour(s)). No results found.  ED Clinical Impression  Acute non-recurrent maxillary sinusitis  Rash  Essential hypertension   ED Assessment/Plan  Pt hypertensive today. States BP was high when she sees a physician.  Has not taken hydrochlorothiazide yet although she took the atenolol this morning.  Pt has no historical evidence of end organ damage. Pt denies any CNS type sx such as HA, visual changes, focal paresis, or new onset seizure activity. Pt denies any CV sx such as CP, dyspnea, palpitations, pedal edema, tearing pain radiating to back or abd. Pt denied any renal sx such as anuria or hematuria. Pt denies recent use of OTC medications such as nasal decongestants.  States that she measures her blood pressure every day at home, and that it is elevated always when she sees Dr.  Algis DownsAdvised her to continue to keep an eye on this.  Will provide a primary care referral list and order primary care referral for routine care as she does not have a primary care physician.  She may follow-up with her cardiologist as  well.  She also has a sinusitis.  No indications for antibiotics.  Home with saline nasal irrigation, Flonase, regular Mucinex.  Rash does not appear to be scabies, varicella.  It does not appear to be a life-threatening cause.  That is most likely from dry skin secondary to new soap.  We will have her discontinue the hydrocortisone and try some triamcinolone.  If that does not work, we will have her start some prednisone to help prevent herself from itching at night.  Advised her to switch back to her normal soap.  We will also send home with Bactroban to help prevent secondary infection as she does have extensive scabbing.  Discussed  MDM, plan and followup with patient. Discussed sn/sx that should prompt return to the ED. patient agrees with plan.   Meds  ordered this encounter  Medications  . triamcinolone cream (KENALOG) 0.1 %    Sig: Apply 1 application topically 2 (two) times daily. Apply for 2 weeks. May use on face    Dispense:  30 g    Refill:  0  . predniSONE (STERAPRED UNI-PAK 21 TAB) 10 MG (21) TBPK tablet    Sig: Dispense one 6 day pack. Take as directed with food.    Dispense:  21 tablet    Refill:  0  . mupirocin ointment (BACTROBAN) 2 %    Sig: Apply 1 application topically 3 (three) times daily.    Dispense:  22 g    Refill:  0  . fluticasone (FLONASE) 50 MCG/ACT nasal spray    Sig: Place 2 sprays into both nostrils daily.    Dispense:  16 g    Refill:  0    *This clinic note was created using Scientist, clinical (histocompatibility and immunogenetics). Therefore, there may be occasional mistakes despite careful proofreading.   ?   Domenick Gong, MD 04/05/17 1910

## 2017-04-05 NOTE — Discharge Instructions (Signed)
Take the medication as written. Start Mucinex to keep the mucous thin and to decongest you.  Return to the ER if you get worse, have a fever >100.4, or for any concerns. You may take 400-600 mg of motrin with 1 gram of tylenol up to 3-4 times a day as needed for pain. This is an effective combination for pain.  Most sinus infections are viral and do not need antibiotics unless you have a high fever, have had this for 10 days, or you get better and then get sick again.  Come back here if these happen and we will start you on antibiotics at that time.  use a NeilMed sinus rinse as often as you want to to reduce nasal congestion. Follow the directions on the box.   It is important to keep your blood pressure under good control, as having a elevated for prolonged periods of time significantly increases your risk of stroke, heart attacks, kidney damage, eye damage, and other problems.  Continue keeping a log of your blood pressure.  Follow-up with a primary care physician of your choice, see list below. Return immediately to the ER if you start having chest pain, headache, problems seeing, problems talking, problems walking, if you feel like you're about to pass out, if you do pass out, if you have a seizure, or for any other concerns.  Try the triamcinolone for the itching.  You may also try some Claritin or Zyrtec.  If this does not help, then try the steroids.  The Bactroban will help prevent an infection.  Go to www.goodrx.com to look up your medications. This will give you a list of where you can find your prescriptions at the most affordable prices. Or ask the pharmacist what the cash price is, or if they have any other discount programs available to help make your medication more affordable. This can be less expensive than what you would pay with insurance.    Here is a list of primary care providers who are taking new patients:  Dr. Elizabeth Sauer, Dr. Schuyler Amor [address] [place]  [place] [address] [phone]  [facility] [address] [place] [address]  [phone]  [facility] [address] [place], [place] [address] [phone]  [facility] [address] [place]  [phone] [place], [place] [address]  Here are clinics/ other resources who will see you if you do not have insurance. Some have certain criteria that you must meet. Call them and find out what they are:  [facility]: [address] [place], [place] [address] Phone: [phone] Hours: First and Third Saturdays of each Month, 9 a.m. - 1 p.m.  [facility]: [address], [address] [place], [place] [address] Phone: [phone] Hours: Tuesday, 4 p.m. - 8 p.m. Thursday, 1 p.m. - 8 p.m. Wednesday, 9 a.m. - [facility] [address] [place], [place] [address] Phone: [phone] Pharmacy Phone Number: [phone] Dental Phone Number: [phone] [facility] Insurance Help: [phone]  Dental Hours: Monday - Thursday, 8 a.m. - 6 p.m.  [facility] [address], [place], [place] [address] Phone: [phone] Pharmacy Phone Number: [phone] [facility] Insurance Help: [phone]  [facility] [address] [place] [place], [place] [address] Phone: [phone] Pharmacy Phone Number: [phone] [facility] Insurance Help: [phone]  [facility] [address] [place], [place] [address] Phone: [phone] [facility] Insurance Help: [phone]   [facility] [address] [place], [place] [address] Phone: [phone]  Go to www.goodrx.com to look up your medications. This will give you a list of where you can find your prescriptions at the most affordable prices. Or ask the pharmacist what the cash price is, or if they have any other discount programs available to help make your medication more affordable. This can be less expensive than what you would  pay with insurance.

## 2017-04-05 NOTE — ED Triage Notes (Addendum)
Patient complains of sinus pain and pressure with sneezing, congestion that started yesterday suddenly,  Patient also complains of a bilateral rash on her forearms x 1 week.

## 2017-09-10 ENCOUNTER — Ambulatory Visit
Admission: EM | Admit: 2017-09-10 | Discharge: 2017-09-10 | Disposition: A | Discharge status: Home / Self Care | Payer: Medicare HMO | Attending: Family Medicine | Admitting: Family Medicine

## 2017-09-10 ENCOUNTER — Other Ambulatory Visit: Payer: Self-pay

## 2017-09-10 DIAGNOSIS — J0100 Acute maxillary sinusitis, unspecified: Secondary | ICD-10-CM

## 2017-09-10 DIAGNOSIS — R05 Cough: Secondary | ICD-10-CM

## 2017-09-10 DIAGNOSIS — R059 Cough, unspecified: Secondary | ICD-10-CM

## 2017-09-10 MED ORDER — BENZONATATE 100 MG PO CAPS: 100.0000 mg | ORAL_CAPSULE | Freq: Three times a day (TID) | ORAL | 0 refills | Status: AC | PRN

## 2017-09-10 MED ORDER — LORATADINE 10 MG PO TABS: 10.0000 mg | ORAL_TABLET | Freq: Every day | ORAL | 0 refills | Status: AC

## 2017-09-10 MED ORDER — AMOXICILLIN-POT CLAVULANATE 875-125 MG PO TABS: 1.0000 | ORAL_TABLET | Freq: Two times a day (BID) | ORAL | 0 refills | Status: AC

## 2017-09-10 NOTE — Discharge Instructions (Addendum)
Take medication as prescribed. Rest. Drink plenty of fluids.  ° °Follow up with your primary care physician this week as needed. Return to Urgent care for new or worsening concerns.  ° °

## 2017-09-10 NOTE — ED Triage Notes (Signed)
Patient complains of cough, congestion, sinus pain and pressure x 4 days.

## 2017-09-10 NOTE — ED Provider Notes (Signed)
[facility] ____________________________________________  Time seen: Approximately 10:10 AM  I have reviewed the triage vital signs and the nursing notes.   HISTORY  Chief Complaint Cough  HPI Connie Blackwell is a 67 y.o. female presenting for evaluation of 4-5 days of runny nose, nasal congestion and sinus pressure.  States sinus pressure is particularly in her cheeks and forehead.  States some postnasal drainage.  States biggest complaint is sinus pressure, currently mild.  States occasional cough.  States able to get some drainage out through her nose but mostly postnasal drainage.  Denies known fevers.  Continues to eat and drink well.  Takes occasional Benadryl at night, has not been taking any other over-the-counter medications for the same complaints.  Denies known sick contacts.  Denies other aggravating or alleviating factors. Denies chest pain, shortness of breath, abdominal pain, or rash. Denies recent sickness. Denies recent antibiotic use.  Patient with previous MI, hypertension and acid reflux.  Denies any recent cardiac changes.    Past Medical History:  Diagnosis Date  . GERD (gastroesophageal reflux disease)   . Hypertension     There are no active problems to display for this patient.   Past Surgical History:  Procedure Laterality Date  . ABDOMINAL HYSTERECTOMY    . CORONARY ANGIOPLASTY WITH STENT PLACEMENT       No current facility-administered medications for this encounter.   Current Outpatient Medications:  .  aspirin 81 MG chewable tablet, Chew 81 mg by mouth daily., Disp: , Rfl:  .  atenolol (TENORMIN) 25 MG tablet, Take 25 mg by mouth daily., Disp: , Rfl:  .  famotidine (PEPCID) 20 MG tablet, Take 20 mg by mouth 2 (two) times daily., Disp: , Rfl:  .  fluticasone (FLONASE) 50 MCG/ACT nasal spray, Place 2 sprays into both nostrils daily., Disp: 16 g, Rfl: 0 .  hydrochlorothiazide (HYDRODIURIL) 25 MG tablet, Take 25 mg by mouth daily.,  Disp: , Rfl:  .  mupirocin ointment (BACTROBAN) 2 %, Apply 1 application topically 3 (three) times daily., Disp: 22 g, Rfl: 0 .  triamcinolone cream (KENALOG) 0.1 %, Apply 1 application topically 2 (two) times daily. Apply for 2 weeks. May use on face, Disp: 30 g, Rfl: 0 .  [START ON 09/12/2017] amoxicillin-clavulanate (AUGMENTIN) 875-125 MG tablet, Take 1 tablet by mouth every 12 (twelve) hours., Disp: 20 tablet, Rfl: 0 .  benzonatate (TESSALON PERLES) 100 MG capsule, Take 1 capsule (100 mg total) by mouth 3 (three) times daily as needed for cough., Disp: 15 capsule, Rfl: 0 .  loratadine (CLARITIN) 10 MG tablet, Take 1 tablet (10 mg total) by mouth daily., Disp: 10 tablet, Rfl: 0  Allergies Sulfa antibiotics  Family History  Problem Relation Age of Onset  . Cancer Mother   . Heart disease Father   . Alzheimer's disease Father     Social History Social History   Tobacco Use  . Smoking status: Former Games developer  . Smokeless tobacco: Never Used  Substance Use Topics  . Alcohol use: No  . Drug use: No    Review of Systems Constitutional: No fever/chills ENT: No sore throat. Cardiovascular: Denies chest pain. Respiratory: Denies shortness of breath. Gastrointestinal: No abdominal pain.  Musculoskeletal: Negative for back pain. Skin: Negative for rash.   ____________________________________________   PHYSICAL EXAM:  VITAL SIGNS: ED Triage Vitals  Enc Vitals Group     BP 09/10/17 0857 (!) 149/79     Pulse Rate 09/10/17 0857 62  Resp 09/10/17 0857 18     Temp 09/10/17 0857 98 F (36.7 C)     Temp Source 09/10/17 0857 Oral     SpO2 09/10/17 0857 99 %     Weight 09/10/17 0857 162 lb (73.5 kg)     Height 09/10/17 0857 5\' 7"  (1.702 m)     Head Circumference --      Peak Flow --      Pain Score 09/10/17 0855 6     Pain Loc --      Pain Edu? --      Excl. in GC? --    Constitutional: Alert and oriented. Well appearing and in no acute distress. Eyes: Conjunctivae are  normal.  Head: Atraumatic.Mild tenderness to palpation bilateral frontal and mild to moderate bilateral maxillary sinuses. No swelling. No erythema.   Ears: no erythema, normal TMs bilaterally.   Nose: nasal congestion with bilateral nasal turbinate erythema and edema.   Mouth/Throat: Mucous membranes are moist.  Oropharynx non-erythematous.No tonsillar swelling or exudate.  Neck: No stridor.  No cervical spine tenderness to palpation. Hematological/Lymphatic/Immunilogical: No cervical lymphadenopathy. Cardiovascular: Normal rate, regular rhythm. Grossly normal heart sounds.  Good peripheral circulation. Respiratory: Normal respiratory effort.  No retractions. No wheezes, rales or rhonchi. Good air movement.  Musculoskeletal: Steady gait.  Neurologic:  Normal speech and language. No gait instability. Skin:  Skin is warm, dry and intact. No rash noted. Psychiatric: Mood and affect are normal. Speech and behavior are normal.  ___________________________________________   LABS (all labs ordered are listed, but only abnormal results are displayed)  Labs Reviewed - No data to display ____________________________________________  PROCEDURES Procedures   INITIAL IMPRESSION / ASSESSMENT AND PLAN / ED COURSE  Pertinent labs & imaging results that were available during my care of the patient were reviewed by me and considered in my medical decision making (see chart for details).  Well-appearing patient.  No acute distress.  Suspect recent viral illness.  Discussed in detail with patient suspect that patient is still having viral illness that she is reporting 4 to 5 days of symptoms.  Discussed will start daily Claritin as well as PRN Tessalon Perles.  Discussed at that point if symptoms continue past 2 to 3 days then to start oral Augmentin.  Discussed strict for reevaluation for any worsening concerns.  Encourage rest, fluids, supportive care.Discussed indication, risks and benefits of  medications with patient.  Discussed follow up with Primary care physician this week as needed. Discussed follow up and return parameters including no resolution or any worsening concerns. Patient verbalized understanding and agreed to plan.   ____________________________________________   FINAL CLINICAL IMPRESSION(S) / ED DIAGNOSES  Final diagnoses:  Acute maxillary sinusitis, recurrence not specified  Cough     ED Discharge Orders        Ordered    amoxicillin-clavulanate (AUGMENTIN) 875-125 MG tablet  Every 12 hours     09/10/17 0947    loratadine (CLARITIN) 10 MG tablet  Daily     09/10/17 0947    benzonatate (TESSALON PERLES) 100 MG capsule  3 times daily PRN     09/10/17 0947       Note: This dictation was prepared with Dragon dictation along with smaller phrase technology. Any transcriptional errors that result from this process are unintentional.         Renford DillsMiller, Willette Mudry, NP 09/10/17 1014
